# Patient Record
Sex: Male | Born: 2010 | Marital: Single | Smoking: Never smoker
Health system: Northeastern US, Academic
[De-identification: ages and names within clinical notes are randomized; demographics above are authoritative.]

## PROBLEM LIST (undated history)

## (undated) DIAGNOSIS — Z9229 Personal history of other drug therapy: Secondary | ICD-10-CM

## (undated) HISTORY — DX: Personal history of other drug therapy: Z92.29

---
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
## 8469-12-10 DEATH — deceased
